# Patient Record
Sex: Male | Born: 1985 | Race: White | Hispanic: No | Marital: Single | Smoking: Current every day smoker
Health system: Southern US, Community
[De-identification: ages and names within clinical notes are randomized; demographics above are authoritative.]

## PROBLEM LIST (undated history)

## (undated) DIAGNOSIS — IMO0002 Reserved for concepts with insufficient information to code with codable children: Secondary | ICD-10-CM

## (undated) DIAGNOSIS — I10 Essential (primary) hypertension: Secondary | ICD-10-CM

## (undated) DIAGNOSIS — F988 Other specified behavioral and emotional disorders with onset usually occurring in childhood and adolescence: Secondary | ICD-10-CM

## (undated) DIAGNOSIS — F419 Anxiety disorder, unspecified: Secondary | ICD-10-CM

## (undated) HISTORY — DX: Anxiety disorder, unspecified: F41.9

## (undated) HISTORY — DX: Reserved for concepts with insufficient information to code with codable children: IMO0002

---
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
## 2006-03-18 ENCOUNTER — Encounter: Payer: Self-pay | Admitting: *Deleted

## 2007-09-17 ENCOUNTER — Emergency Department (HOSPITAL_COMMUNITY): Admission: EM | Admit: 2007-09-17 | Discharge: 2007-09-17 | Payer: Self-pay | Admitting: Family Medicine

## 2009-02-14 ENCOUNTER — Emergency Department (HOSPITAL_COMMUNITY): Admission: EM | Admit: 2009-02-14 | Discharge: 2009-02-14 | Payer: Self-pay | Admitting: Family Medicine

## 2011-02-27 LAB — CLOSTRIDIUM DIFFICILE EIA: C difficile Toxins A+B, EIA: NEGATIVE

## 2011-02-27 LAB — STOOL CULTURE

## 2011-02-27 LAB — GIARDIA/CRYPTOSPORIDIUM SCREEN(EIA)
Cryptosporidium Screen (EIA): NEGATIVE
Giardia Screen - EIA: NEGATIVE

## 2011-08-27 LAB — POCT RAPID STREP A: Streptococcus, Group A Screen (Direct): NEGATIVE

## 2011-10-08 ENCOUNTER — Encounter: Payer: Self-pay | Admitting: Gastroenterology

## 2011-10-28 ENCOUNTER — Telehealth: Payer: Self-pay | Admitting: Gastroenterology

## 2011-10-28 ENCOUNTER — Ambulatory Visit (INDEPENDENT_AMBULATORY_CARE_PROVIDER_SITE_OTHER): Payer: BC Managed Care – PPO

## 2011-10-28 ENCOUNTER — Ambulatory Visit: Payer: Self-pay | Admitting: Gastroenterology

## 2011-10-28 DIAGNOSIS — M2550 Pain in unspecified joint: Secondary | ICD-10-CM

## 2011-10-28 DIAGNOSIS — F411 Generalized anxiety disorder: Secondary | ICD-10-CM

## 2011-10-28 NOTE — Telephone Encounter (Signed)
Do not bill 

## 2012-02-21 ENCOUNTER — Emergency Department (HOSPITAL_COMMUNITY)
Admission: EM | Admit: 2012-02-21 | Discharge: 2012-02-22 | Disposition: A | Discharge status: Home / Self Care | Payer: Self-pay | Attending: Emergency Medicine | Admitting: Emergency Medicine

## 2012-02-21 DIAGNOSIS — X500XXA Overexertion from strenuous movement or load, initial encounter: Secondary | ICD-10-CM | POA: Insufficient documentation

## 2012-02-21 DIAGNOSIS — Y9361 Activity, american tackle football: Secondary | ICD-10-CM | POA: Insufficient documentation

## 2012-02-21 DIAGNOSIS — S93409A Sprain of unspecified ligament of unspecified ankle, initial encounter: Secondary | ICD-10-CM | POA: Insufficient documentation

## 2012-02-21 DIAGNOSIS — I10 Essential (primary) hypertension: Secondary | ICD-10-CM | POA: Insufficient documentation

## 2012-02-21 HISTORY — DX: Essential (primary) hypertension: I10

## 2012-02-22 ENCOUNTER — Encounter (HOSPITAL_COMMUNITY): Payer: Self-pay | Admitting: *Deleted

## 2012-02-22 ENCOUNTER — Emergency Department (HOSPITAL_COMMUNITY): Payer: Self-pay

## 2012-02-22 MED ORDER — HYDROCODONE-ACETAMINOPHEN 5-325 MG PO TABS
1.0000 | ORAL_TABLET | Freq: Once | ORAL | Status: AC
  Administered 2012-02-22: 1 via ORAL
  Filled 2012-02-22: qty 1

## 2012-02-22 MED ORDER — IBUPROFEN 800 MG PO TABS: 800.0000 mg | ORAL_TABLET | Freq: Three times a day (TID) | ORAL | Status: DC

## 2012-02-22 NOTE — ED Notes (Signed)
The pt stepped in a hole while playing football approx 1900 tonight.  painin his foot only

## 2012-02-22 NOTE — ED Provider Notes (Signed)
History     CSN: [id number]  Arrival date & time 02/21/12  2354   First MD Initiated Contact with Patient 02/22/12 [phone]      Chief Complaint  Patient presents with  . Foot Injury    (Consider location/radiation/quality/duration/timing/severity/associated sxs/prior treatment) Patient is a 26 y.o. male presenting with foot injury. The history is provided by the patient.  Foot Injury  The incident occurred less than 1 hour ago. The incident occurred in the yard. The injury mechanism was torsion. The pain is present in the left foot. The quality of the pain is described as sharp and throbbing. The pain is moderate. The pain has been constant since onset. Pertinent negatives include no numbness, no inability to bear weight, no loss of motion, no muscle weakness, no loss of sensation and no tingling. He reports no foreign bodies present. The symptoms are aggravated by bearing weight, activity and palpation. He has tried nothing for the symptoms. The treatment provided no relief.   Pt was playing football this evening when he stepped in a hole, inverting his foot. Did not have pain immediately after but developed pain subsequently. Able to bear wt though painful. Pain located to lateral aspect of foot. Has not noted any deformity. Tried ice and elevation at home prior to presentation with little relief.  Past Medical History  Diagnosis Date  . Hypertension     History reviewed. No pertinent past surgical history.  No family history on file.  History  Substance Use Topics  . Smoking status: Never Smoker   . Smokeless tobacco: Not on file  . Alcohol Use: Yes      Review of Systems  Constitutional: Negative for fever, chills, activity change and appetite change.  Musculoskeletal: Positive for myalgias. Negative for joint swelling.  Skin: Negative for color change, rash and wound.  Neurological: Negative for tingling, weakness and numbness.    Allergies  Review of patient's  allergies indicates no known allergies.  Home Medications   Current Outpatient Rx  Name Route Sig Dispense Refill  . ALPRAZOLAM 0.5 MG PO TABS Oral Take 0.5 mg by mouth 2 (two) times daily as needed. anxiety      BP 170/84  Pulse 110  Temp(Src) 98.2 F (36.8 C) (Oral)  Resp 18  SpO2 98%  Physical Exam  Nursing note and vitals reviewed. Constitutional: He appears well-developed and well-nourished. No distress.  HENT:  Head: Normocephalic and atraumatic.  Eyes: Conjunctivae are normal.  Neck: Normal range of motion.  Musculoskeletal:       Left ankle: He exhibits normal range of motion, no swelling, no ecchymosis, no deformity, no laceration and normal pulse. no tenderness. Achilles tendon normal.       Left foot: He exhibits bony tenderness. He exhibits no swelling, normal capillary refill, no crepitus and no deformity.       L foot: ttp at 5th MT head. FROM, NVI with sensory intact to lt touch. No edema, erythema, ecchymoses noted.  Neurological: He is alert.  Skin: Skin is warm and dry. No rash noted. He is not diaphoretic.  Psychiatric: He has a normal mood and affect.    ED Course  Procedures (including critical care time)  Labs Reviewed - No data to display Dg Foot Complete Left  02/22/2012  *RADIOLOGY REPORT*  Clinical Data: Foot pain, most severe at fifth metatarsal, football injury  LEFT FOOT - COMPLETE 3+ VIEW  Comparison: None.  Findings: No fracture or dislocation is seen.  The joint  spaces are preserved.  The visualized soft tissues are unremarkable.  IMPRESSION: No fracture or dislocation is seen.  Original Report Authenticated By: Charline Bills, M.D.     1. Ankle sprain       MDM  Pt with tenderness to 5th MT head. Xrays which I personally reviewed negative for fx. Suspect sprain. Placed on crutches, in ASO. Instructed to use ibu for pain control. To f/u with ortho if not improving. Return precautions discussed.        Grant Fontana,  [place] 02/25/12 [phone]

## 2012-02-22 NOTE — ED Notes (Signed)
Pt states understanding of discharge instructions 

## 2012-02-22 NOTE — Discharge Instructions (Signed)
Your xray was negative and did not show signs of any broken bones. This is likely a sprain of one of the ligaments in your ankle that is causing you to have pain in that area. You have been given pain medication here. Please stay non weight bearing and use crutches for the next several days. Use the brace for comfort. Take the ibuprofen as prescribed for the next 5 days. Elevate the foot and apply ice to it for 20 minutes several times a day. Follow up with Dr. Lestine Box of orthopaedics if you are not improving.  RESOURCE GUIDE  Dental Problems  Patients with Medicaid: [facility] [phone] [address]                                           [phone] [address] Phone:  [phone]                                                  Phone:  [phone]  If unable to pay or uninsured, contact:  [facility] or [facility]. to become qualified for the adult dental clinic.  Chronic Pain Problems Contact [facility]  [phone] Patients need to be referred by their primary care doctor.  Insufficient Money for Medicine Contact [employer]:  call "211" or [facility] [phone] 061-0940.  No Primary Care Doctor Call [facility]  [phone] Other agencies that provide inexpensive medical care    [facility]  [phone]    [facility]  [phone]    [facility]  [phone]    [facility]  [phone]    [facility]  [phone]    [facility]  [phone]  Psychological Services [facility]  [phone] [facility]  [phone] [facility]   [phone] (emergency services [phone])  Substance Abuse Resources [facility]  3313924439 [facility] 737-690-5270 [facility] [place] [phone] Floydene Flock 6844089676 Residential & Outpatient Substance Abuse Program  (972)[phone]  Abuse/Neglect [facility] Child Abuse Hotline 867-707-2132 [facility] Child Abuse Hotline 408-627-3844 (After Hours)  Emergency Shelter [facility] [phone]  Maternity Homes [facility] [place] [facility] [phone] [facility] [phone]  MRSA Hotline #:   8283848368    [facility] Resources  [facility] [place]     [employer]                          [facility] [address] [place]                       [address]      [address] [place] [address]  [address]  Cristobal Goldmann Phone:  [phone]                                   Phone:  [phone]                 Phone:  [phone]  [facility] Mental [facility] Phone:  [phone]  [facility] Child Abuse Hotline [phone] (After Hours)  Ankle Sprain An ankle sprain is an injury to the strong, fibrous tissues (ligaments) that hold the bones of your ankle joint together.  CAUSES Ankle sprain usually is caused by a fall or by twisting your ankle. People who participate in sports are more prone to these types of injuries.  SYMPTOMS  Symptoms of ankle sprain include:  Pain in your ankle. The pain may be present at rest or only when you are trying to stand or walk.   Swelling.   Bruising. Bruising may develop immediately or within 1 to 2 days after your injury.   Difficulty standing or walking.  DIAGNOSIS  Your caregiver will ask you details about your injury and perform a physical exam of your ankle to determine if you have an ankle sprain. During the physical exam, your caregiver will press and squeeze specific areas of your foot and ankle. Your caregiver will try to move your ankle in certain ways. An X-ray exam may be done to be sure a bone was not broken or a ligament did not separate from one of the bones in your ankle (avulsion).  TREATMENT  Certain  types of braces can help stabilize your ankle. Your caregiver can make a recommendation for this. Your caregiver may recommend the use of medication for pain. If your sprain is severe, your caregiver may refer you to a surgeon who helps to restore function to parts of your skeletal system (orthopedist) or a physical therapist. HOME CARE INSTRUCTIONS  Apply ice to your injury for 1 to 2 days or as directed by your caregiver. Applying ice helps to reduce inflammation and pain.  Put ice in a plastic bag.   Place a towel between your skin and the bag.   Leave the ice on for 15 to 20 minutes at a time, every 2 hours while you are awake.   Take over-the-counter or prescription medicines for pain, discomfort, or fever only as directed by your caregiver.   Keep your injured leg elevated, when possible, to lessen swelling.   If your caregiver recommends crutches, use them as instructed. Gradually, put weight on the affected ankle. Continue to use crutches or a cane until you can walk without feeling pain in your ankle.   If you have a plaster splint, wear the splint as directed by your caregiver. Do not rest it on anything harder than a pillow the first 24 hours. Do not put weight on it. Do not get it wet. You may take it off to take a shower or bath.   You may have been given an elastic bandage to wear around your ankle to provide support. If the elastic bandage is too tight (you have numbness or tingling in your foot or your foot becomes cold and blue), adjust the bandage to make it comfortable.   If you have an air splint, you  may blow more air into it or let air out to make it more comfortable. You may take your splint off at night and before taking a shower or bath.   Wiggle your toes in the splint several times per day if you are able.  SEEK MEDICAL CARE IF:   You have an increase in bruising, swelling, or pain.   Your toes feel cold.   Pain relief is not achieved with medication.  SEEK  IMMEDIATE MEDICAL CARE IF: Your toes are numb or blue or you have severe pain. MAKE SURE YOU:   Understand these instructions.   Will watch your condition.   Will get help right away if you are not doing well or get worse.  Document Released: 11/03/2005 Document Revised: 10/23/2011 Document Reviewed: 06/07/2008 [facility] Patient Information 2012 [place], [place].

## 2012-02-25 NOTE — ED Provider Notes (Signed)
Medical screening examination/treatment/procedure(s) were performed by non-physician practitioner and as supervising physician I was immediately available for consultation/collaboration.   Gerhard Munch, MD 02/25/12 [phone]

## 2012-04-27 ENCOUNTER — Other Ambulatory Visit: Payer: Self-pay | Admitting: Physician Assistant

## 2012-04-27 MED ORDER — ALPRAZOLAM 0.5 MG PO TABS: 0.5000 mg | ORAL_TABLET | Freq: Two times a day (BID) | ORAL | Status: DC | PRN

## 2012-06-01 ENCOUNTER — Encounter: Payer: Self-pay | Admitting: Family Medicine

## 2012-06-01 ENCOUNTER — Ambulatory Visit (INDEPENDENT_AMBULATORY_CARE_PROVIDER_SITE_OTHER): Payer: Self-pay | Admitting: Family Medicine

## 2012-06-01 VITALS — BP 120/84 | HR 97 | Temp 98.6°F | Resp 16 | Ht 72.0 in | Wt 212.8 lb

## 2012-06-01 DIAGNOSIS — F411 Generalized anxiety disorder: Secondary | ICD-10-CM

## 2012-06-01 DIAGNOSIS — F419 Anxiety disorder, unspecified: Secondary | ICD-10-CM

## 2012-06-01 DIAGNOSIS — O10019 Pre-existing essential hypertension complicating pregnancy, unspecified trimester: Secondary | ICD-10-CM

## 2012-06-01 MED ORDER — ALPRAZOLAM 0.5 MG PO TABS: ORAL_TABLET | ORAL | Status: DC

## 2012-06-01 MED ORDER — OLMESARTAN MEDOXOMIL-HCTZ 40-25 MG PO TABS: 1.0000 | ORAL_TABLET | Freq: Every day | ORAL | Status: DC

## 2012-06-01 NOTE — Progress Notes (Signed)
Subjective: 26 year old male here for a couple of things. He's been using Xanax off and on for a long time for anxiety. He often finds he needs it twice a day. He is well read and has talked a lot of people, and does not want to be on an SSRI. I spoke to him about the problems of long-term Xanax use, and recommended strongly the minimization of using it.  He has had high blood pressure for a number of years also. He is seeing a cardiologist for this has been evaluated extensively, including an echocardiogram, and they cannot find anything that they attributed the high blood pressure 2. He does have a strong family history for high blood pressure. His pressure was very high last week and he began taking his mothers samples of Benicar HCT 40/25. His blood pressure reading has come down to as low as about 130s over 80s at home, and his reading today was good here in the office.  Objective: Chest clear. Heart regular without murmurs.  Assessment: Hypertension Anxiety  Plan: We'll give Xanax for when necessary use, but have encouraged him to use it sparingly. If he finds he is really needing it on a regular basis I still strongly encourage that he take an SSRI, which can greatly reduce the need for frequent Xanax use. Will continue him on the Benicar HCT since it is done him well, and he has some samples he can use until his insurance kicks in.

## 2012-06-01 NOTE — Patient Instructions (Addendum)
Minimize use of the alprazolam.  I understand you have used it for a long time, but my recommendation is for you to come get a prescription for an SSRI for regular use if you find you are needing the alprazolam daily.

## 2012-07-13 ENCOUNTER — Emergency Department (INDEPENDENT_AMBULATORY_CARE_PROVIDER_SITE_OTHER)
Admission: EM | Admit: 2012-07-13 | Discharge: 2012-07-13 | Disposition: A | Discharge status: Home / Self Care | Payer: Self-pay | Source: Home / Self Care | Attending: Emergency Medicine | Admitting: Emergency Medicine

## 2012-07-13 ENCOUNTER — Encounter (HOSPITAL_COMMUNITY): Payer: Self-pay

## 2012-07-13 DIAGNOSIS — J019 Acute sinusitis, unspecified: Secondary | ICD-10-CM

## 2012-07-13 MED ORDER — AMOXICILLIN 500 MG PO CAPS: 1000.0000 mg | ORAL_CAPSULE | Freq: Three times a day (TID) | ORAL | Status: AC

## 2012-07-13 MED ORDER — HYDROCOD POLST-CHLORPHEN POLST 10-8 MG/5ML PO LQCR: 5.0000 mL | Freq: Two times a day (BID) | ORAL | Status: DC | PRN

## 2012-07-13 NOTE — ED Provider Notes (Signed)
Chief Complaint  Patient presents with  . URI    History of Present Illness:   Shane Payne is a 26 year old male who has had a three-day history of nasal congestion with yellow drainage, sinus pressure, headache, cough productive yellow sputum, wheezing, temperature of up to 103, chills, scratchy throat, and postnasal drip. He denies any earache, chest pain, or GI symptoms.  Review of Systems:  Other than noted above, the patient denies any of the following symptoms. Systemic:  No fever, chills, sweats, fatigue, myalgias, headache, or anorexia. Eye:  No redness, pain or drainage. ENT:  No earache, ear congestion, nasal congestion, sneezing, rhinorrhea, sinus pressure, sinus pain, post nasal drip, or sore throat. Lungs:  No cough, sputum production, wheezing, shortness of breath, or chest pain. GI:  No abdominal pain, nausea, vomiting, or diarrhea.  PMFSH:  Past medical history, family history, social history, meds, and allergies were reviewed.  Physical Exam:   Vital signs:  BP 120/75  Pulse 80  Temp 98.4 F (36.9 C) (Oral)  Resp 18  SpO2 100% General:  Alert, in no distress. Eye:  No conjunctival injection or drainage. Lids were normal. ENT:  TMs and canals were normal, without erythema or inflammation.  Nasal mucosa was congested with light yellow drainage.  Mucous membranes were moist.  Pharynx was clear, without exudate or drainage.  There were no oral ulcerations or lesions. Neck:  Supple, no adenopathy, tenderness or mass. Lungs:  No respiratory distress.  Lungs were clear to auscultation, without wheezes, rales or rhonchi.  Breath sounds were clear and equal bilaterally.  Heart:  Regular rhythm, without gallops, murmers or rubs. Skin:  Clear, warm, and dry, without rash or lesions.  Assessment:  The encounter diagnosis was Acute sinusitis.  Plan:   1.  The following meds were prescribed:   New Prescriptions   AMOXICILLIN (AMOXIL) 500 MG CAPSULE    Take 2 capsules (1,000 mg  total) by mouth 3 (three) times daily.   CHLORPHENIRAMINE-HYDROCODONE (TUSSIONEX) 10-8 MG/5ML LQCR    Take 5 mLs by mouth every 12 (twelve) hours as needed.   2.  The patient was instructed in symptomatic care and handouts were given. 3.  The patient was told to return if becoming worse in any way, if no better in 3 or 4 days, and given some red flag symptoms that would indicate earlier return.   Reuben Likes, MD 07/13/12 [phone]

## 2012-07-13 NOTE — ED Notes (Signed)
Reports URI type syx , using OTC medications w minimal relief; NAD at present

## 2012-07-20 ENCOUNTER — Telehealth: Payer: Self-pay

## 2012-07-20 NOTE — Telephone Encounter (Signed)
LMOM to CB. I am working on a prior auth for [employer] [facility] (form at [employer]' desk) and need to know if pt has tried other meds for his HTN other than Bystolic, Nifedipine and Norvasc?  Pt CB and stated these are all of the ones that he has tried. Reported that while on all of them his BP fluctuated greatly and she also had some SEs of ankle edema. The Benicar samples he has used has been the first one that seems to consistently lower his BP and has not given him any SEs thus far. The prior auth form requires that pt try one of several alternatives bf they will cover the Benicar. The pt has not tried any of these alternatives. Dr Alwyn Ren, do you want to change pt's Rx to one of the alternatives on PA form - pt's chart w/form are in your box. [id number]

## 2012-07-22 ENCOUNTER — Telehealth (HOSPITAL_COMMUNITY): Payer: Self-pay | Admitting: *Deleted

## 2012-07-22 MED ORDER — LOSARTAN POTASSIUM-HCTZ 100-12.5 MG PO TABS: 1.0000 | ORAL_TABLET | Freq: Every day | ORAL | Status: DC

## 2012-07-22 NOTE — ED Notes (Signed)
Pt. called and asked for a refill of Tussionex cough med., so he does not cough at night.  Pt. states he was here last Tues. 8/27.  I asked Dr. Lorenz Coaster and he approved a refill of 4 oz 1 tsp every 12 hrs. Pt. wants Rx. called to [facility] on [address]  I called the pharmacist @ [phone].  She said he just filled a 14 day supply on 9/2 and did not get the Amoxicillin which is against their policy. She said someone filling in, must have done that.  I told her to hold Rx. until I called back.  I called pt. back.  He said he had gotten Amoxicillin 500 mg. #40 from his brother, and planned to get the other #20 when his insurance started. States he is taking it as directed.  I asked him how much Tussionex he was taking and he said 1 oz. in AM and 2 oz. at bedtime to sleep. He said he was a big guy and was taking a little extra to keep from coughing.  I asked how much he had left he said  1 oz. I told him he had taken 10 days worth in 4 days which is to much. I told him I would call back.  Discussedwith Dr. Lorenz Coaster. He said cancel Rx. for 4 oz. I called pt. back and told him we are not refilling the medication. Use 1 tsp. at bedtime of what he has left and use OTC cough medicine during the day. Pt. said he understands.  I called the pharmacy back and cancelled the refill order and told them what he said about the antibiotics. Vassie Moselle 07/22/2012

## 2012-07-22 NOTE — Telephone Encounter (Signed)
Pt CB and I explained new Rx and instr's. Pt agreed to try and to monitor BP and to call if he has any SEs/problems.

## 2012-07-22 NOTE — Telephone Encounter (Signed)
Spoke w/Heather because Dr Alwyn Ren is OO office - ins will not cover Benicar because pt has never tried any meds in the same class on their preferred list. Herbert Seta changed Rx and sending it in to pharmacy.   LMOM for pt to CB. Advise pt of change to another BP med in the same class as Benicar to try. Have pt monitor his BP and RTC sooner than 6 mos if it runs too low, or high.

## 2012-08-23 ENCOUNTER — Ambulatory Visit (INDEPENDENT_AMBULATORY_CARE_PROVIDER_SITE_OTHER): Payer: BC Managed Care – PPO | Admitting: Internal Medicine

## 2012-08-23 ENCOUNTER — Encounter: Payer: Self-pay | Admitting: Internal Medicine

## 2012-08-23 VITALS — BP 132/86 | HR 73 | Temp 98.1°F | Resp 18 | Ht 72.0 in | Wt 221.0 lb

## 2012-08-23 DIAGNOSIS — F419 Anxiety disorder, unspecified: Secondary | ICD-10-CM

## 2012-08-23 DIAGNOSIS — I10 Essential (primary) hypertension: Secondary | ICD-10-CM

## 2012-08-23 DIAGNOSIS — F411 Generalized anxiety disorder: Secondary | ICD-10-CM

## 2012-08-23 MED ORDER — ALPRAZOLAM 0.5 MG PO TABS: ORAL_TABLET | ORAL | Status: DC

## 2012-08-23 NOTE — Progress Notes (Signed)
  Subjective:    Patient ID: Shane Payne, male    DOB: Jul 04, 1986, 26 y.o.   MRN: [id number]  HPI See recent ovs Dr Alwyn Ren Patient Active Problem List  Diagnosis  . HTN (hypertension)  . GAD (generalized anxiety disorder)   insur denied benicar which has worked in past so has has been on losartan/hct-home BPs Have ranged from 140-155 systolic so he changed today to his mom's benicar with normal bp in the office today--he would like approval to take this med  Also continues with benzos for anxiety Present since late teens-often related to work, new social situations, stress at home, disagreements With others -typical anxiety symptoms quickly settled by xanax-uses 2 some days and none somedays Has insomnia at times w/ mind racing and will use X to afford sleep Very adamant about not being on sSRI after friends experiences-has never heard of wellbutrin  Denies cigs/drugs Doesn't use ETOH for anxiety  Both parents w/ HTN Review of Systems No chest pain/palpitrations/DOE/edema   no HAs No wt changes/fatigue Objective:   Physical Exam Filed Vitals:   08/23/12 1839  BP: 132/86  Pulse: 73  Temp: 98.1 F (36.7 C)  Resp: 18  after benicar 40/25 today Pupils equal round reactive to light and accommodation No nodes or thyromegaly Heart regular rate 70 Mood stable/       Assessment & Plan:  Problem #1 hypertension Will attempt prior authorization for Benicar 40/HCT 25-he has a prescription at the pharmacy  from Dr. Elvera Maria reports failure to control blood pressure with Hyzaar 100/12.5  Problem #2 generalized anxiety disorder We spent 10 minutes discussing the development of tolerance with benzodiazepines/he is referred to the website for Wellbutrin to learn about this medication as I believe it will decrease his need for Xanax/at this point he is not in favor of adding another medication but this discussion will continue at subsequent  Visits,Take yearly if his  requirements for dosage increase. He is not agreeable to therapy either. He is referred to "overcoming anxiety for dummies" for other methods of controlling anxiety.  Meds ordered this encounter  Medications  . ALPRAZolam (XANAX) 0.5 MG tablet    Sig: Take 1/2 to 1 daily for anxiety.  Use sparingly.    Dispense:  30 tablet    Refill:  3   F/u 4 months

## 2012-08-24 ENCOUNTER — Encounter: Payer: Self-pay | Admitting: Internal Medicine

## 2012-08-24 DIAGNOSIS — I10 Essential (primary) hypertension: Secondary | ICD-10-CM | POA: Insufficient documentation

## 2012-08-24 DIAGNOSIS — F411 Generalized anxiety disorder: Secondary | ICD-10-CM | POA: Insufficient documentation

## 2012-09-11 ENCOUNTER — Ambulatory Visit: Payer: BC Managed Care – PPO

## 2012-09-11 ENCOUNTER — Ambulatory Visit (INDEPENDENT_AMBULATORY_CARE_PROVIDER_SITE_OTHER): Payer: BC Managed Care – PPO | Admitting: Emergency Medicine

## 2012-09-11 VITALS — BP 153/81 | HR 90 | Temp 98.4°F | Resp 20 | Ht 72.0 in | Wt 210.0 lb

## 2012-09-11 DIAGNOSIS — M25579 Pain in unspecified ankle and joints of unspecified foot: Secondary | ICD-10-CM

## 2012-09-11 DIAGNOSIS — S93409A Sprain of unspecified ligament of unspecified ankle, initial encounter: Secondary | ICD-10-CM

## 2012-09-11 MED ORDER — HYDROCODONE-ACETAMINOPHEN 5-325 MG PO TABS: 1.0000 | ORAL_TABLET | ORAL | Status: DC | PRN

## 2012-09-11 NOTE — Progress Notes (Signed)
[facility] [address], [place] [address] [phone]  Date:  09/11/2012   Name:  Shane Payne   DOB:  1986/11/04   MRN:  [id number]  PCP:  No primary provider on file.    Chief Complaint: Ankle Injury   History of Present Illness:  Shane Payne is a 26 y.o. very pleasant male patient who presents with the following:  Injured last evening while visiting his deer stands.  Has increased pain and swelling and discoloration in left lateral ankle and heel.  Not able to sleep as a result of the injury.  Denies other complaints.  Requests a refill of his alprazolam for six months instead of the three months he is receiving.  Patient Active Problem List  Diagnosis  . HTN (hypertension)  . GAD (generalized anxiety disorder)    Past Medical History  Diagnosis Date  . Hypertension   . Ulcer   . Anxiety     History reviewed. No pertinent past surgical history.  History  Substance Use Topics  . Smoking status: Never Smoker   . Smokeless tobacco: Not on file  . Alcohol Use: Yes    History reviewed. No pertinent family history.  No Known Allergies  Medication list has been reviewed and updated.  Current Outpatient Prescriptions on File Prior to Visit  Medication Sig Dispense Refill  . ALPRAZolam (XANAX) 0.5 MG tablet Take 1/2 to 1 daily for anxiety.  Use sparingly.  30 tablet  3  . losartan-hydrochlorothiazide (HYZAAR) 100-12.5 MG per tablet Take 1 tablet by mouth daily.  30 tablet  5    Review of Systems:  As per HPI, otherwise negative.    Physical Examination: Filed Vitals:   09/11/12 1011  BP: 153/81  Pulse: 90  Temp: 98.4 F (36.9 C)  Resp: 20   Filed Vitals:   09/11/12 1011  Height: 6' (1.829 m)  Weight: 210 lb (95.255 kg)   Body mass index is 28.48 kg/(m^2). Ideal Body Weight: Weight in (lb) to have BMI = 25: 183.9    GEN: WDWN, NAD, Non-toxic, Alert & Oriented x 3 HEENT: Atraumatic, Normocephalic.  Ears and  Nose: No external deformity. EXTR: No clubbing/cyanosis/edema NEURO: Normal gait.  PSYCH: Normally interactive. Conversant. Not depressed or anxious appearing.  Calm demeanor.  Left ankle:  Marked swelling and tenderness ankle.  Ecchymosis lateral malleolus.  Guards passive ROM  Assessment and Plan: Ankle sprain Air cast Hydrocodone Ice, elevate, crutches WBAT Follow up in one week if not improved  Carmelina Dane, MD  [facility] reading (PRIMARY) by  Dr. Dareen Piano.  negative.

## 2012-09-17 ENCOUNTER — Telehealth: Payer: Self-pay

## 2012-09-17 ENCOUNTER — Other Ambulatory Visit: Payer: Self-pay | Admitting: Emergency Medicine

## 2012-09-17 NOTE — Telephone Encounter (Signed)
I advised him I can do this but if he is not better by Monday he will need to return for recheck.

## 2012-09-17 NOTE — Telephone Encounter (Signed)
PT STATES HE IS STILL HAVING PROBLEMS WITH HIS KNEE AND WOULD LIKE AN OOW NOTE UNTIL Monday PLEASE CALL [phone]

## 2012-09-18 ENCOUNTER — Other Ambulatory Visit: Payer: Self-pay | Admitting: Emergency Medicine

## 2012-09-20 ENCOUNTER — Other Ambulatory Visit: Payer: Self-pay | Admitting: Emergency Medicine

## 2012-09-29 NOTE — Progress Notes (Signed)
Reviewed and agree.

## 2012-12-24 ENCOUNTER — Other Ambulatory Visit: Payer: Self-pay | Admitting: Internal Medicine

## 2012-12-26 NOTE — Telephone Encounter (Signed)
He was told to f/u 4 mos-I have only seen him once and do not know who his primay is/he is on HTN meds as well and has not had labs within the year. Since he is on a diuretic, he needs labs. He should see who he can get an appt with at 104 this week

## 2012-12-27 ENCOUNTER — Telehealth: Payer: Self-pay

## 2012-12-27 NOTE — Telephone Encounter (Signed)
I have advised him of this.

## 2012-12-27 NOTE — Telephone Encounter (Signed)
Pt says that the pharmacy has sent in request for his xanex and they have not heard anything back from [place] please call him at [phone]

## 2012-12-27 NOTE — Telephone Encounter (Signed)
He is overdue for follow up, I called him to advise. Dr Merla Riches needed to see him about his HTN/ labs, I called him to let him know. Amy

## 2013-01-20 ENCOUNTER — Ambulatory Visit (INDEPENDENT_AMBULATORY_CARE_PROVIDER_SITE_OTHER): Payer: BC Managed Care – PPO | Admitting: Family Medicine

## 2013-01-20 VITALS — BP 158/102 | HR 102 | Temp 98.5°F | Resp 16 | Ht 74.0 in | Wt 223.0 lb

## 2013-01-20 DIAGNOSIS — F411 Generalized anxiety disorder: Secondary | ICD-10-CM

## 2013-01-20 DIAGNOSIS — R059 Cough, unspecified: Secondary | ICD-10-CM

## 2013-01-20 DIAGNOSIS — F419 Anxiety disorder, unspecified: Secondary | ICD-10-CM

## 2013-01-20 DIAGNOSIS — R05 Cough: Secondary | ICD-10-CM

## 2013-01-20 DIAGNOSIS — J019 Acute sinusitis, unspecified: Secondary | ICD-10-CM

## 2013-01-20 DIAGNOSIS — I10 Essential (primary) hypertension: Secondary | ICD-10-CM

## 2013-01-20 MED ORDER — PROPRANOLOL HCL 40 MG PO TABS: 40.0000 mg | ORAL_TABLET | Freq: Two times a day (BID) | ORAL | Status: AC

## 2013-01-20 MED ORDER — FLUTICASONE PROPIONATE 50 MCG/ACT NA SUSP: 2.0000 | Freq: Every day | NASAL | Status: AC

## 2013-01-20 MED ORDER — HYDROCODONE-HOMATROPINE 5-1.5 MG/5ML PO SYRP: 5.0000 mL | ORAL_SOLUTION | Freq: Every evening | ORAL | Status: AC | PRN

## 2013-01-20 MED ORDER — ALPRAZOLAM 0.5 MG PO TABS: ORAL_TABLET | ORAL | Status: AC

## 2013-01-20 MED ORDER — AMOXICILLIN-POT CLAVULANATE 875-125 MG PO TABS: 1.0000 | ORAL_TABLET | Freq: Two times a day (BID) | ORAL | Status: DC

## 2013-01-20 NOTE — Progress Notes (Signed)
[facility]:  Office Visit  Chief Complaint:  Chief Complaint  Patient presents with  . Cough    x 2 days  . Medication Refill    HPI: Shane Payne is a 27 y.o. male who complains of :  1. 2 day history of sinus like sxs, cough green sputum, GF has been sick. Nonsmoker. Denies asthma or allergies. No fevers, chills. Treid Delsyn without relief.+ Sinus pain and HA 2. HTN-was well controlled on Benicar but was switched to Hyzaar due to insurance issues and his BP is in the 150s  SBP,  100s DBP.  3. He states that he has been on Xanax for 5 years, he was placed on it by Dr. Merla Riches. He does not want to try any other maintenance meds. He takes it about 3 days a week most of the time, on some days he takes it BID prn. He denies any dependency/abuse issues, he does not want to try any other meds ie SSRIs, Wellbutrin, Buspar or try therapy. HE states that he doesn;t understand why it is his problem that other people are abusing the drug and he has to get this lecture everytime he comes in for a refill of his Xanax. This is the third time he has had this conversation.     Past Medical History  Diagnosis Date  . Hypertension   . Ulcer   . Anxiety    History reviewed. No pertinent past surgical history. History   Social History  . Marital Status: Single    Spouse Name: N/A    Number of Children: N/A  . Years of Education: N/A   Social History Main Topics  . Smoking status: Never Smoker   . Smokeless tobacco: None  . Alcohol Use: Yes  . Drug Use: None  . Sexually Active: None   Other Topics Concern  . None   Social History Narrative  . None   Family History  Problem Relation Age of Onset  . Hypertension Mother   . Hypertension Father    No Known Allergies Prior to Admission medications   Medication Sig Start Date End Date Taking? Authorizing Provider  ALPRAZolam Prudy Feeler) 0.5 MG tablet Take 1/2 to 1 daily for anxiety.  Use sparingly. 08/23/12  Yes  Tonye Pearson, MD  losartan-hydrochlorothiazide (HYZAAR) 100-12.5 MG per tablet Take 1 tablet by mouth daily. 07/22/12 07/22/13 Yes Heather M Marte, PA-C  HYDROcodone-acetaminophen (NORCO/VICODIN) 5-325 MG per tablet TAKE 1 TO 2 TABLETS BY MOUTH EVERY 4 HOURS AS NEEDED FOR PAIN 09/17/12   Sondra Barges, PA-C     ROS: The patient denies fevers, chills, night sweats, unintentional weight loss, chest pain, palpitations, wheezing, dyspnea on exertion, nausea, vomiting, abdominal pain, dysuria, hematuria, melena, numbness, weakness, or tingling.   All other systems have been reviewed and were otherwise negative with the exception of those mentioned in the HPI and as above.    PHYSICAL EXAM: Filed Vitals:   01/20/13 1608  BP: 158/102  Pulse: 102  Temp: 98.5 F (36.9 C)  Resp: 16   Filed Vitals:   01/20/13 1608  Height: 6\' 2"  (1.88 m)  Weight: 223 lb (101.152 kg)   Body mass index is 28.62 kg/(m^2).  General: Alert, no acute distress, Agitated and belligerent at times HEENT:  Normocephalic, atraumatic, oropharynx patent. + sinus tenderness Cardiovascular:  Slight tachycardia, Regular  rhythm, no rubs murmurs or gallops.  No Carotid bruits, radial pulse intact. No pedal edema.  Respiratory: Clear  to auscultation bilaterally.  No wheezes, rales, or rhonchi.  No cyanosis, no use of accessory musculature GI: No organomegaly, abdomen is soft and non-tender, positive bowel sounds.  No masses. Skin: No rashes. Neurologic: Facial musculature symmetric. Psychiatric: Patient is not having thoughts of SI/HI.  Lymphatic: No cervical lymphadenopathy Musculoskeletal: Gait intact.   LABS:    EKG/XRAY:   Primary read interpreted by Dr. Conley Rolls at [facility].   ASSESSMENT/PLAN: Encounter Diagnoses  Name Primary?  . HTN (hypertension) Yes  . Acute sinusitis   . Generalized anxiety disorder   . Cough   . Anxiety    27 y/o male who is agitated, borderline belligerent on this encounter. He was already  angry when I came into the room since he had to wait 3 hrs to be seen then he got more upset when we discussed his Xanax refills. I told him that if he was here for just an acute sinusitis issue then it would have been a 10 minute visit. However, like everyone else in the waiting room he wants to get his blood pressure medicine and anxiety addressed with a provider who has never seen him before the process takes longer, hence the long waiting time. He was advised that since he has an established relationship with Dr. Merla Riches, he should make an appointment with him for his chronic medical problems.   1. Augmentin, flonase and hydromet  for acute sinusitis sxs.   2. Trial of Propranolol, 1 week  F/u by phone to see if he has any improvement in HTN and anxiety. I went ahead and ask him to dc the hyzaar. He was advise to keep his BP below 140/90. Advise to monitor BP and pulse. He states that he has been on Norvasc before and that made his legs swell. I would like to try a trial of BB so that we can see if it works and also to document the different types of meds he has been on in order to get Benicar HCT approved by his insurance if there is treatment failure. He has been worked up by cardiology for his HTN. He has a family hx of HTN.   3. Refilled his xanax, we had a long discussion about other alternatives to help with his anxiety besides Xanax. We discussed risks and benefits. He ademantly denies dependence/abuse issues or having thoughts of SI/HI. He does not want to try anything else since he has read on the internet what these drugs can do, they can change how you think and feel. I told him that Xanax probably does the same thing in that sense but he was not open to trying anything different. I was able to convince him to try the BB based on HTN and insurance reasoning.    F/u with Dr. Merla Riches in 3 months otherwise sooner if BP is not well controlled. Parameters given. Labs pending    Rockne Coons, DO 01/20/2013 5:53 PM

## 2013-01-20 NOTE — Progress Notes (Deleted)
[facility]:  Office Visit  Chief Complaint:  Chief Complaint  Patient presents with  . Cough    x 2 days  . Medication Refill    HPI: Shane Payne is a 27 y.o. male who complains of  ***  Past Medical History  Diagnosis Date  . Hypertension   . Ulcer   . Anxiety    History reviewed. No pertinent past surgical history. History   Social History  . Marital Status: Single    Spouse Name: N/A    Number of Children: N/A  . Years of Education: N/A   Social History Main Topics  . Smoking status: Never Smoker   . Smokeless tobacco: None  . Alcohol Use: Yes  . Drug Use: None  . Sexually Active: None   Other Topics Concern  . None   Social History Narrative  . None   Family History  Problem Relation Age of Onset  . Hypertension Mother   . Hypertension Father    No Known Allergies Prior to Admission medications   Medication Sig Start Date End Date Taking? Authorizing Provider  ALPRAZolam Prudy Feeler) 0.5 MG tablet Take 1/2 to 1 daily for anxiety.  Use sparingly. 08/23/12  Yes Tonye Pearson, MD  losartan-hydrochlorothiazide (HYZAAR) 100-12.5 MG per tablet Take 1 tablet by mouth daily. 07/22/12 07/22/13 Yes Heather M Marte, PA-C  HYDROcodone-acetaminophen (NORCO/VICODIN) 5-325 MG per tablet TAKE 1 TO 2 TABLETS BY MOUTH EVERY 4 HOURS AS NEEDED FOR PAIN 09/17/12   Sondra Barges, PA-C     ROS: The patient denies fevers, chills, night sweats, unintentional weight loss, chest pain, palpitations, wheezing, dyspnea on exertion, nausea, vomiting, abdominal pain, dysuria, hematuria, melena, numbness, weakness, or tingling. ***  All other systems have been reviewed and were otherwise negative with the exception of those mentioned in the HPI and as above.    PHYSICAL EXAM: Filed Vitals:   01/20/13 1608  BP: 158/102  Pulse: 102  Temp: 98.5 F (36.9 C)  Resp: 16   Filed Vitals:   01/20/13 1608  Height: 6\' 2"  (1.88 m)  Weight: 223 lb (101.152 kg)   Body  mass index is 28.62 kg/(m^2).  General: Alert, no acute distress HEENT:  Normocephalic, atraumatic, oropharynx patent.  Cardiovascular:  Regular rate and rhythm, no rubs murmurs or gallops.  No Carotid bruits, radial pulse intact. No pedal edema.  Respiratory: Clear to auscultation bilaterally.  No wheezes, rales, or rhonchi.  No cyanosis, no use of accessory musculature GI: No organomegaly, abdomen is soft and non-tender, positive bowel sounds.  No masses. Skin: No rashes. Neurologic: Facial musculature symmetric. Psychiatric: Patient is appropriate throughout our interaction. Lymphatic: No cervical lymphadenopathy Musculoskeletal: Gait intact.   LABS: Results for orders placed during the hospital encounter of 02/14/09  CLOSTRIDIUM DIFFICILE EIA      Result Value Range   Specimen Description STOOL     Special Requests NONE     C difficile Toxins A+B, EIA NEGATIVE     Report Status 02/15/2009 FINAL    STOOL CULTURE      Result Value Range   Specimen Description STOOL     Special Requests NONE     Culture       Value: NO SALMONELLA, SHIGELLA, CAMPYLOBACTER, OR YERSINIA ISOLATED   Report Status 02/18/2009 FINAL    GIARDIA/CRYPTOSPORIDIUM SCREEN(EIA)      Result Value Range   Giardia Screen - EIA NEGATIVE     Cryptosporidium Screen (EIA) NEGATIVE  Specimen Source-GICRSC STOOL       EKG/XRAY:   Primary read interpreted by Dr. Conley Rolls at [facility].   ASSESSMENT/PLAN: No diagnosis found.     Hamilton Capri PHUONG, DO 01/20/2013 5:26 PM

## 2013-01-24 LAB — COMPREHENSIVE METABOLIC PANEL
ALT: 42 U/L (ref 0–53)
CO2: 17 mEq/L — ABNORMAL LOW (ref 19–32)
Chloride: 102 mEq/L (ref 96–112)
Potassium: 3.9 mEq/L (ref 3.5–5.3)
Sodium: 140 mEq/L (ref 135–145)
Total Bilirubin: 0.3 mg/dL (ref 0.3–1.2)
Total Protein: 7.9 g/dL (ref 6.0–8.3)

## 2013-01-24 LAB — COMPREHENSIVE METABOLIC PANEL WITH GFR
AST: 23 U/L (ref 0–37)
Albumin: 5.2 g/dL (ref 3.5–5.2)
Alkaline Phosphatase: 61 U/L (ref 39–117)
BUN: 15 mg/dL (ref 6–23)
Calcium: 10.1 mg/dL (ref 8.4–10.5)
Creat: 0.84 mg/dL (ref 0.50–1.35)
Glucose, Bld: 96 mg/dL (ref 70–99)

## 2013-01-31 ENCOUNTER — Encounter: Payer: Self-pay | Admitting: Family Medicine

## 2013-03-16 ENCOUNTER — Other Ambulatory Visit: Payer: Self-pay | Admitting: Physician Assistant

## 2013-03-28 ENCOUNTER — Other Ambulatory Visit: Payer: Self-pay | Admitting: Physician Assistant

## 2013-03-30 ENCOUNTER — Other Ambulatory Visit: Payer: Self-pay | Admitting: Physician Assistant

## 2013-03-30 ENCOUNTER — Other Ambulatory Visit: Payer: Self-pay | Admitting: Internal Medicine
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
## 2013-03-31 ENCOUNTER — Other Ambulatory Visit: Payer: Self-pay | Admitting: Physician Assistant

## 2013-03-31 NOTE — Telephone Encounter (Signed)
Advised pt that we can not send in a Rx for meloxicam w/out eval. Pt stated he understands and will RTC if knee gets too bad. I advised pt that he can try either ibuprofen or Aleve which are in the same family as meloxicam and that he should RTC if persists/worsens for eval in case it is gout or something else other than the arthritis he is assuming which would require different medications. Pt agreed.

## 2013-05-26 ENCOUNTER — Other Ambulatory Visit: Payer: Self-pay | Admitting: Physician Assistant
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
## 2015-04-28 ENCOUNTER — Encounter (HOSPITAL_COMMUNITY): Payer: Self-pay | Admitting: Emergency Medicine

## 2015-04-28 ENCOUNTER — Emergency Department (HOSPITAL_COMMUNITY)
Admission: EM | Admit: 2015-04-28 | Discharge: 2015-04-29 | Disposition: A | Discharge status: Home / Self Care | Payer: Self-pay | Attending: Emergency Medicine | Admitting: Emergency Medicine

## 2015-04-28 DIAGNOSIS — F419 Anxiety disorder, unspecified: Secondary | ICD-10-CM | POA: Insufficient documentation

## 2015-04-28 DIAGNOSIS — IMO0002 Reserved for concepts with insufficient information to code with codable children: Secondary | ICD-10-CM

## 2015-04-28 DIAGNOSIS — Y9289 Other specified places as the place of occurrence of the external cause: Secondary | ICD-10-CM | POA: Insufficient documentation

## 2015-04-28 DIAGNOSIS — T23231A Burn of second degree of multiple right fingers (nail), not including thumb, initial encounter: Secondary | ICD-10-CM | POA: Insufficient documentation

## 2015-04-28 DIAGNOSIS — I10 Essential (primary) hypertension: Secondary | ICD-10-CM | POA: Insufficient documentation

## 2015-04-28 DIAGNOSIS — Z872 Personal history of diseases of the skin and subcutaneous tissue: Secondary | ICD-10-CM | POA: Insufficient documentation

## 2015-04-28 DIAGNOSIS — Y998 Other external cause status: Secondary | ICD-10-CM | POA: Insufficient documentation

## 2015-04-28 DIAGNOSIS — Z79899 Other long term (current) drug therapy: Secondary | ICD-10-CM | POA: Insufficient documentation

## 2015-04-28 DIAGNOSIS — X141XXA Other contact with hot air and other hot gases, initial encounter: Secondary | ICD-10-CM | POA: Insufficient documentation

## 2015-04-28 DIAGNOSIS — Z7951 Long term (current) use of inhaled steroids: Secondary | ICD-10-CM | POA: Insufficient documentation

## 2015-04-28 DIAGNOSIS — Y9389 Activity, other specified: Secondary | ICD-10-CM | POA: Insufficient documentation

## 2015-04-28 MED ORDER — IBUPROFEN 600 MG PO TABS: 600.0000 mg | ORAL_TABLET | Freq: Three times a day (TID) | ORAL | Status: AC

## 2015-04-28 MED ORDER — MORPHINE SULFATE 4 MG/ML IJ SOLN
4.0000 mg | Freq: Once | INTRAMUSCULAR | Status: AC
  Administered 2015-04-28: 4 mg via INTRAMUSCULAR
  Filled 2015-04-28: qty 1

## 2015-04-28 MED ORDER — OXYCODONE-ACETAMINOPHEN 5-325 MG PO TABS
2.0000 | ORAL_TABLET | Freq: Once | ORAL | Status: AC
  Administered 2015-04-28: 2 via ORAL
  Filled 2015-04-28: qty 2

## 2015-04-28 MED ORDER — OXYCODONE-ACETAMINOPHEN 5-325 MG PO TABS: 1.0000 | ORAL_TABLET | ORAL | Status: AC | PRN

## 2015-04-28 MED ORDER — SILVER SULFADIAZINE 1 % EX CREA
TOPICAL_CREAM | Freq: Once | CUTANEOUS | Status: AC
  Administered 2015-04-28: 22:00:00 via TOPICAL
  Filled 2015-04-28: qty 85

## 2015-04-28 MED ORDER — IBUPROFEN 200 MG PO TABS
600.0000 mg | ORAL_TABLET | Freq: Once | ORAL | Status: AC
  Administered 2015-04-28: 600 mg via ORAL
  Filled 2015-04-28: qty 3
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
## 2015-04-28 NOTE — Discharge Instructions (Signed)
Burn Care Burns hurt your skin. When your skin is hurt, it is easier to get an infection. Follow your doctor's directions to help prevent an infection. HOME CARE  Wash your hands well before you change your bandage.  Change your bandage as often as told by your doctor.  Remove the old bandage. If the bandage sticks, soak it off with cool, clean water.  Gently clean the burn with mild soap and water.  Pat the burn dry with a clean, dry cloth.  Put a thin layer of medicated cream on the burn.  Put a clean bandage on as told by your doctor.  Keep the bandage clean and dry.  Raise (elevate) the burn for the first 24 hours. After that, follow your doctor's directions.  Only take medicine as told by your doctor. GET HELP RIGHT AWAY IF:   You have too much pain.  The skin near the burn is red, tender, puffy (swollen), or has red streaks.  The burn area has yellowish white fluid (pus) or a bad smell coming from it.  You have a fever. MAKE SURE YOU:   Understand these instructions.  Will watch your condition.  Will get help right away if you are not doing well or get worse. Document Released: 08/12/2008 Document Revised: 01/26/2012 Document Reviewed: 03/26/2011 [facility] Patient Information 2015 [place], [place]. This information is not intended to replace advice given to you by your health care provider. Make sure you discuss any questions you have with your health care provider.  Second-Degree Burn A second-degree burn affects the 2 outer layers of skin. The outer layer (epidermis) and the layer underneath it (dermis) are both burned. Another name for this type of burn is a partial thickness burn. A second-degree burn may be called minor or major. This depends on the size of the burn. It also depends on what parts of the skin are burned. Minor burns may be treated with first aid. Major burns are a medical emergency. A second-degree burn is worse than a first-degree burn, but not as  bad as a third-degree burn. A first-degree burn affects only the epidermis. A third-degree burn goes through all the layers of skin. A second-degree burn usually heals in 3 to 4 weeks. A minor second-degree burn usually does not leave a scar.Deeper second-degree burns may lead to scarring of the skin or contractures over joints.Contractures are scars that form over joints and may lead to reduced mobility at those joints. CAUSES  Heat (thermal) injury. This happens when skin comes in contact with something very hot. It could be a flame, a hot object, hot liquid, or steam. Most second-degree burns are thermal injuries.  Radiation. Sunlight is one type of radiation that can burn the skin. Another type of radiation is used to heat food. Radiation is also used to treat some diseases, such as cancer. All types of radiation can burn the skin. Sunlight usually causes a first-degree burn. Radiation used for heating food or treating a disease can cause a second-degree burn.  Electricity. Electrical burns can cause more damage under the skin than on the surface. They should always be treated as major burns.  Chemicals. Many chemicals can burn the skin. The burn should be flushed with cool water and checked by an emergency caregiver. SYMPTOMS Symptoms of second-degree burns include:  Severe pain.  Extreme tenderness.  Deep redness.  Blistered skin.  Skin that has changed color.It might look blotchy, wet, or shiny.  Swelling. TREATMENT Some second-degree burns may  need to be treated in a hospital. These include major burns, electrical burns, and chemical burns. Many other second-degree burns can be treated with regular first aid, such as:  Cooling the burn. Use cool, germ-free (sterile) salt water. Place the burned area of skin into a tub of water, or cover the burned area with clean, wet towels.  Taking pain medicine.  Removing the dead skin from broken blisters. A trained caregiver may do  this. Do not pop blisters.  Gently washing your skin with mild soap.  Covering the burned area with a cream.Silver sulfadiazine is a cream for burns. An antibiotic cream, such as bacitracin, may also be used to fight infection. Do not use other ointments or creams unless your caregiver says it is okay.  Protecting the burn with a sterile, non-sticky bandage.  Bandaging fingers and toes separately. This keeps them from sticking together.  Taking an antibiotic. This can help prevent infection.  Getting a tetanus shot. HOME CARE INSTRUCTIONS Medication  Take any medicine prescribed by your caregiver. Follow the directions carefully.  Ask your caregiver if you can take over-the-counter medicine to relieve pain and swelling. Do not give aspirin to children.  Make sure your caregiver knows about all other medicines you take.This includes over-the-counter medicines. Burn care  You will need to change the bandage on your burn. You may need to do this 2 or 3 times each day.  Gently clean the burned area.  Put ointment on it.  Cover the burn with a sterile bandage.  For some deeper burns or burns that cover a large area, compression garments may be prescribed. These garments can help minimize scarring and protect your mobility.  Do not put butter or oil on your skin. Use only the cream prescribed by your caregiver.  Do not put ice on your burn.  Do not break blisters on your skin.  Keep the bandaged area dry. You might need to take a sponge bath for awhile.Ask your caregiver when you can take a shower or a tub bath again.  Do not scratch an itchy burn. Your caregiver may give you medicine to relieve very bad itching.  Infection is a big danger after a second-degree burn. Tell your caregiver right away if you have signs of infection, such as:  Redness or changing color in the burned area.  Fluid leaking from the burn.  Swelling in the burn area.  A bad smell coming from the  wound. Follow-up  Keep all follow-up appointments.This is important. This is how your caregiver can tell if your treatment is working.  Protect your burn from sunlight.Use sunscreen whenever you go outside.Burned areas may be sensitive to the sun for up to 1 year. Exposure to the sun may also cause permanent darkening of scars. SEEK MEDICAL CARE IF:  You have any questions about medicines.  You have any questions about your treatment.  You wonder if it is okay to do a particular activity.  You develop a fever of more than 100.5 F (38.1 C). SEEK IMMEDIATE MEDICAL CARE IF:  You think your burn might be infected. It may change color, become red, leak fluid, swell, or smell bad.  You develop a fever of more than 102 F (38.9 C). Document Released: 04/07/2011 Document Revised: 01/26/2012 Document Reviewed: 04/07/2011 [facility]ExitCare Patient Information 2015 [place], [place]LL[place]. This information is not intended to replace advice given to you by your health care provider. Make sure you discuss any questions you have with your health care  provider. You have been given Silvadene, please change the dressing on a daily basis.  The hand can be either soak or during her shower, remove the dressing and reapply a thick coat of Silvadene and occlusive dressing wrap each individually please make a point with your primary care physician to have the wounds examined in 2 days.
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
## 2015-04-29 NOTE — ED Notes (Signed)
Used Telfa, Silvadene cream and stretch bandage roll to wrap pt fingers. Pt agitated because of pain. Pt BP also elevated. Informed RN.

## 2018-01-17 ENCOUNTER — Emergency Department (HOSPITAL_COMMUNITY)
Admission: EM | Admit: 2018-01-17 | Discharge: 2018-01-17 | Discharge status: Against Medical Advice | Payer: Medicaid Other | Attending: Emergency Medicine | Admitting: Emergency Medicine

## 2018-01-17 ENCOUNTER — Encounter (HOSPITAL_COMMUNITY): Payer: Self-pay

## 2018-01-17 DIAGNOSIS — R45851 Suicidal ideations: Secondary | ICD-10-CM | POA: Diagnosis not present

## 2018-01-17 DIAGNOSIS — I10 Essential (primary) hypertension: Secondary | ICD-10-CM | POA: Insufficient documentation

## 2018-01-17 DIAGNOSIS — F329 Major depressive disorder, single episode, unspecified: Secondary | ICD-10-CM | POA: Insufficient documentation

## 2018-01-17 DIAGNOSIS — F17210 Nicotine dependence, cigarettes, uncomplicated: Secondary | ICD-10-CM | POA: Diagnosis not present

## 2018-01-17 DIAGNOSIS — Z79899 Other long term (current) drug therapy: Secondary | ICD-10-CM | POA: Diagnosis not present

## 2018-01-17 DIAGNOSIS — F32A Depression, unspecified: Secondary | ICD-10-CM

## 2018-01-17 HISTORY — DX: Other specified behavioral and emotional disorders with onset usually occurring in childhood and adolescence: F98.8

## 2018-01-17 LAB — COMPREHENSIVE METABOLIC PANEL
ALT: 30 U/L (ref 17–63)
AST: 24 U/L (ref 15–41)
Albumin: 4.1 g/dL (ref 3.5–5.0)
Alkaline Phosphatase: 61 U/L (ref 38–126)
Anion gap: 8 (ref 5–15)
BUN: 15 mg/dL (ref 6–20)
CO2: 27 mmol/L (ref 22–32)
CREATININE: 0.62 mg/dL (ref 0.61–1.24)
Calcium: 9.6 mg/dL (ref 8.9–10.3)
Chloride: 103 mmol/L (ref 101–111)
GFR calc Af Amer: >60 mL/min (ref >60)
GLUCOSE: 109 mg/dL — AB (ref 65–99)
Potassium: 4.1 mmol/L (ref 3.5–5.1)
Sodium: 138 mmol/L (ref 135–145)
Total Bilirubin: 0.6 mg/dL (ref 0.3–1.2)
Total Protein: 7.7 g/dL (ref 6.5–8.1)

## 2018-01-17 LAB — ETHANOL

## 2018-01-17 LAB — CBC
HEMATOCRIT: 44.7 % (ref 39.0–52.0)
Hemoglobin: 14.9 g/dL (ref 13.0–17.0)
MCH: 29.7 pg (ref 26.0–34.0)
MCHC: 33.3 g/dL (ref 30.0–36.0)
MCV: 89 fL (ref 78.0–100.0)
Platelets: 354 10*3/uL (ref 150–400)
RBC: 5.02 MIL/uL (ref 4.22–5.81)
RDW: 12.5 % (ref 11.5–15.5)
WBC: 9.2 10*3/uL (ref 4.0–10.5)

## 2018-01-17 LAB — RAPID URINE DRUG SCREEN, HOSP PERFORMED
Amphetamines: NOT DETECTED
BARBITURATES: NOT DETECTED
Benzodiazepines: POSITIVE — AB
Cocaine: NOT DETECTED
Opiates: NOT DETECTED
Tetrahydrocannabinol: POSITIVE — AB

## 2018-01-17 NOTE — ED Notes (Signed)
Pt left AMA. He did not speak to anyone, but he and his S/O left. No one has been in his room for over an hour and all of his stuff is gone.

## 2018-01-17 NOTE — ED Triage Notes (Signed)
Pt here with wife.  Pt states he has highs and lows.  He goes from working around the house with multiple projects to not wanting to get out of bed.  Has been over a year and has worsened since death of mother.  Pt recognizes he is depressed.  Denies he is suicidal at this time.

## 2018-01-17 NOTE — ED Notes (Signed)
Bed: [facility] Expected date:  Expected time:  Means of arrival:  Comments: 

## 2018-01-17 NOTE — ED Provider Notes (Addendum)
[facility]-EMERGENCY DEPT Provider Note  CSN: [id number] Arrival date & time: 01/17/18 1145  Chief Complaint(s) Depression  HPI Shane Payne is a 32 y.o. male   The history is provided by the patient.  Mental Health Problem  Presenting symptoms: depression and suicidal thoughts (fleeting. no plan or willingness to do so )   Presenting symptoms: no delusions, no disorganized speech, no disorganized thought process, no hallucinations, no homicidal ideas, no suicidal threats and no suicide attempt   Patient accompanied by:  Family member Degree of incapacity (severity):  Moderate Onset quality:  Gradual Duration: 2-3 yrs. Timing:  Constant Progression:  Waxing and waning Chronicity:  Chronic Context: not alcohol use and not drug abuse   Context comment:  Prior EtOH and opioid addiction, clear for 1 yr Treatment compliance:  Untreated Relieved by:  Nothing Worsened by:  Nothing Associated symptoms: euphoric mood   Associated symptoms comment:  Fluctuating highs and lows   Past Medical History Past Medical History:  Diagnosis Date  . Anxiety   . Attention deficit disorder   . Hypertension   . Ulcer    Patient Active Problem List   Diagnosis Date Noted  . HTN (hypertension) 08/24/2012  . GAD (generalized anxiety disorder) 08/24/2012   Home Medication(s) Prior to Admission medications   Medication Sig Start Date End Date Taking? Authorizing Provider  ALPRAZolam Prudy Feeler) 0.5 MG tablet Take 1/2 to 1 daily for anxiety.  Use sparingly. Patient taking differently: Take 0.5 mg by mouth daily as needed for anxiety.  01/20/13   Le, Thao P, DO  fluticasone (FLONASE) 50 MCG/ACT nasal spray Place 2 sprays into the nose daily. Patient not taking: Reported on 04/28/2015 01/20/13   Le, Thao P, DO  HYDROcodone-acetaminophen (NORCO/VICODIN) 5-325 MG per tablet TAKE 1 TO 2 TABLETS BY MOUTH EVERY 4 HOURS AS NEEDED FOR PAIN Patient not taking: Reported on 04/28/2015 09/17/12    Sondra Barges, PA-C  HYDROcodone-homatropine [facility]) 5-1.5 MG/5ML syrup Take 5 mLs by mouth at bedtime as needed for cough. Patient not taking: Reported on 04/28/2015 01/20/13   Le, Thao P, DO  ibuprofen (ADVIL,MOTRIN) 600 MG tablet Take 1 tablet (600 mg total) by mouth 3 (three) times daily. 04/28/15   Earley Favor, NP  losartan-hydrochlorothiazide (HYZAAR) 100-12.5 MG per tablet TAKE 1 TABLET BY MOUTH DAILY. Patient not taking: Reported on 04/28/2015 03/30/13   Godfrey Pick, PA-C  losartan-hydrochlorothiazide (HYZAAR) 100-12.5 MG per tablet TAKE 1 TABLET BY MOUTH DAILY. Patient not taking: Reported on 04/28/2015 05/26/13   Godfrey Pick, PA-C  olmesartan-hydrochlorothiazide (BENICAR HCT) 40-25 MG per tablet Take 1 tablet by mouth daily.    [provider]  oxyCODONE-acetaminophen (PERCOCET/ROXICET) 5-325 MG per tablet Take 1 tablet by mouth every 4 (four) hours as needed for severe pain. 04/28/15   Earley Favor, NP  propranolol (INDERAL) 40 MG tablet Take 1 tablet (40 mg total) by mouth 2 (two) times daily. Patient not taking: Reported on 04/28/2015 01/20/13   Hamilton Capri P, DO  Past Surgical History History reviewed. No pertinent surgical history. Family History Family History  Problem Relation Age of Onset  . Hypertension Mother   . Hypertension Father     Social History Social History   Tobacco Use  . Smoking status: Current Every Day Smoker    Types: Cigars, Cigarettes  . Smokeless tobacco: Never Used  Substance Use Topics  . Alcohol use: No    Frequency: Never    Comment: quit  . Drug use: No   Allergies Patient has no known allergies.  Review of Systems Review of Systems  Psychiatric/Behavioral: Positive for suicidal ideas (fleeting. no plan or willingness to do so ). Negative for hallucinations and homicidal ideas.   All other systems  are reviewed and are negative for acute change except as noted in the HPI  Physical Exam Vital Signs  I have reviewed the triage vital signs BP (!) 152/100 (BP Location: Left Arm)   Pulse 86   Temp 98.2 F (36.8 C) (Oral)   Resp 16   Ht 6\' 1"  (1.854 m)   Wt 86.2 kg (190 lb)   SpO2 100%   BMI 25.07 kg/m   Physical Exam  Constitutional: He is oriented to person, place, and time. He appears well-developed and well-nourished. No distress.  HENT:  Head: Normocephalic and atraumatic.  Right Ear: External ear normal.  Left Ear: External ear normal.  Nose: Nose normal.  Mouth/Throat: Mucous membranes are normal. No trismus in the jaw.  Eyes: Conjunctivae and EOM are normal. No scleral icterus.  Neck: Normal range of motion and phonation normal.  Cardiovascular: Normal rate and regular rhythm.  Pulmonary/Chest: Effort normal. No stridor. No respiratory distress.  Abdominal: He exhibits no distension.  Musculoskeletal: Normal range of motion. He exhibits no edema.  Neurological: He is alert and oriented to person, place, and time.  Skin: He is not diaphoretic.  Psychiatric: He has a normal mood and affect. His behavior is normal.  Vitals reviewed.   ED Results and Treatments Labs (all labs ordered are listed, but only abnormal results are displayed) Labs Reviewed  COMPREHENSIVE METABOLIC PANEL - Abnormal; Notable for the following components:      Result Value   Glucose, Bld 109 (*)    All other components within normal limits  RAPID URINE DRUG SCREEN, HOSP PERFORMED - Abnormal; Notable for the following components:   Benzodiazepines POSITIVE (*)    Tetrahydrocannabinol POSITIVE (*)    All other components within normal limits  ETHANOL  CBC                                                                                                                         EKG  EKG Interpretation  Date/Time:    Ventricular Rate:    PR Interval:    QRS Duration:   QT Interval:    QTC  Calculation:   R Axis:     Text Interpretation:        Radiology No  results found. Pertinent labs & imaging results that were available during my care of the patient were reviewed by me and considered in my medical decision making (see chart for details).  Medications Ordered in ED Medications - No data to display                                                                                                                                  Procedures Procedures  (including critical care time)  Medical Decision Making / ED Course I have reviewed the nursing notes for this encounter and the patient's prior records (if available in EHR or on provided paperwork).    Patient here for chronic depression.  Has not sought care for this in the past.  Denies any  active suicidal ideations, homicidal ideations, auditory/visual hallucinations.  Patient states that he goes to [facility] only to fill his Adderall prescription because of the price.  Has not seen a provider there for evaluation of his depression.  Will obtain screening labs for medical clearance and provide the patient with resources  The patient appears reasonably screened and/or stabilized for discharge and I doubt any other medical condition or other [facility] requiring further screening, evaluation, or treatment in the ED at this time prior to discharge.    Final Clinical Impression(s) / ED Diagnoses Final diagnoses:  Depression, unspecified depression type    Disposition: ELOPED PRIOR TO LAB RESULTS  Condition: Good   ED Discharge Orders    None       This chart was dictated using voice recognition software.  Despite best efforts to proofread,  errors can occur which can change the documentation meaning.   Nira Conn, MD 01/17/18 1322    Nira Conn, MD 01/17/18 1501
